# Patient Record
Sex: Male | Born: 1995 | Race: Black or African American | Hispanic: No | Marital: Single | Smoking: Current some day smoker
Health system: Southern US, Community
[De-identification: ages and names within clinical notes are randomized; demographics above are authoritative.]

---

## 2018-07-25 ENCOUNTER — Emergency Department (HOSPITAL_COMMUNITY): Payer: Self-pay

## 2018-07-25 ENCOUNTER — Emergency Department (HOSPITAL_COMMUNITY)
Admission: EM | Admit: 2018-07-25 | Discharge: 2018-07-25 | Disposition: A | Discharge status: Home / Self Care | Payer: Self-pay | Attending: Emergency Medicine | Admitting: Emergency Medicine

## 2018-07-25 ENCOUNTER — Other Ambulatory Visit: Payer: Self-pay

## 2018-07-25 DIAGNOSIS — R10815 Periumbilic abdominal tenderness: Secondary | ICD-10-CM | POA: Insufficient documentation

## 2018-07-25 DIAGNOSIS — R319 Hematuria, unspecified: Secondary | ICD-10-CM

## 2018-07-25 DIAGNOSIS — R112 Nausea with vomiting, unspecified: Secondary | ICD-10-CM | POA: Insufficient documentation

## 2018-07-25 DIAGNOSIS — R1084 Generalized abdominal pain: Secondary | ICD-10-CM | POA: Insufficient documentation

## 2018-07-25 DIAGNOSIS — R350 Frequency of micturition: Secondary | ICD-10-CM | POA: Insufficient documentation

## 2018-07-25 DIAGNOSIS — R10816 Epigastric abdominal tenderness: Secondary | ICD-10-CM | POA: Insufficient documentation

## 2018-07-25 LAB — CBC WITH DIFFERENTIAL/PLATELET
Abs Immature Granulocytes: 0.02 10*3/uL (ref 0.00–0.07)
Basophils Absolute: 0 10*3/uL (ref 0.0–0.1)
Basophils Relative: 0 %
Eosinophils Absolute: 0.1 10*3/uL (ref 0.0–0.5)
Eosinophils Relative: 2 %
HCT: 45.7 % (ref 39.0–52.0)
Hemoglobin: 14.9 g/dL (ref 13.0–17.0)
Immature Granulocytes: 0 %
Lymphocytes Relative: 15 %
Lymphs Abs: 1 10*3/uL (ref 0.7–4.0)
MCH: 27.9 pg (ref 26.0–34.0)
MCHC: 32.6 g/dL (ref 30.0–36.0)
MCV: 85.4 fL (ref 80.0–100.0)
Monocytes Absolute: 0.3 10*3/uL (ref 0.1–1.0)
Monocytes Relative: 4 %
Neutro Abs: 5.3 10*3/uL (ref 1.7–7.7)
Neutrophils Relative %: 79 %
Platelets: 157 10*3/uL (ref 150–400)
RBC: 5.35 MIL/uL (ref 4.22–5.81)
RDW: 12.1 % (ref 11.5–15.5)
WBC: 6.7 10*3/uL (ref 4.0–10.5)
nRBC: 0 % (ref 0.0–0.2)

## 2018-07-25 LAB — COMPREHENSIVE METABOLIC PANEL
ALT: 18 U/L (ref 0–44)
AST: 17 U/L (ref 15–41)
Albumin: 4.5 g/dL (ref 3.5–5.0)
Alkaline Phosphatase: 64 U/L (ref 38–126)
Anion gap: 8 (ref 5–15)
BUN: 7 mg/dL (ref 6–20)
CO2: 26 mmol/L (ref 22–32)
Calcium: 9.4 mg/dL (ref 8.9–10.3)
Chloride: 104 mmol/L (ref 98–111)
Creatinine, Ser: 0.93 mg/dL (ref 0.61–1.24)
GFR calc Af Amer: >60 mL/min (ref >60)
GFR calc non Af Amer: >60 mL/min (ref >60)
Glucose, Bld: 88 mg/dL (ref 70–99)
Potassium: 3.5 mmol/L (ref 3.5–5.1)
Sodium: 138 mmol/L (ref 135–145)
Total Bilirubin: 0.8 mg/dL (ref 0.3–1.2)
Total Protein: 7 g/dL (ref 6.5–8.1)

## 2018-07-25 LAB — URINALYSIS, ROUTINE W REFLEX MICROSCOPIC
Bacteria, UA: NONE SEEN
Bilirubin Urine: NEGATIVE
Glucose, UA: NEGATIVE mg/dL
Ketones, ur: 80 mg/dL — AB
Nitrite: NEGATIVE
Protein, ur: NEGATIVE mg/dL
RBC / HPF: >50 RBC/hpf — ABNORMAL HIGH (ref 0–5)
Specific Gravity, Urine: 1.024 (ref 1.005–1.030)
pH: 5 (ref 5.0–8.0)

## 2018-07-25 LAB — LIPASE, BLOOD: Lipase: 21 U/L (ref 11–51)

## 2018-07-25 MED ORDER — MORPHINE SULFATE (PF) 4 MG/ML IV SOLN
4.0000 mg | Freq: Once | INTRAVENOUS | Status: AC
  Administered 2018-07-25: 4 mg via INTRAVENOUS
  Filled 2018-07-25: qty 1

## 2018-07-25 MED ORDER — ONDANSETRON HCL 4 MG/2ML IJ SOLN
4.0000 mg | Freq: Once | INTRAMUSCULAR | Status: AC
  Administered 2018-07-25: 4 mg via INTRAVENOUS
  Filled 2018-07-25: qty 2

## 2018-07-25 MED ORDER — ONDANSETRON 4 MG PO TBDP: 4.0000 mg | ORAL_TABLET | Freq: Three times a day (TID) | ORAL | 0 refills | Status: AC | PRN

## 2018-07-25 MED ORDER — SODIUM CHLORIDE 0.9 % IV BOLUS
1000.0000 mL | Freq: Once | INTRAVENOUS | Status: AC
  Administered 2018-07-25: 1000 mL via INTRAVENOUS

## 2018-07-25 NOTE — Discharge Instructions (Signed)
1. Medications: Alternate 600 mg of ibuprofen and [phone] mg of Tylenol every 3 hours as needed for pain. Do not exceed 4000 mg of Tylenol daily.  Take ibuprofen with food to avoid upset stomach.  Take Zofran as needed for nausea.  Wait around 20 minutes before eating or drinking after taking this medication. 2. Treatment: rest, drink plenty of fluids, advance diet slowly.  Start with water and broth then advance to bland foods that will not upset your stomach such as crackers, mashed potatoes, and peanut butter. 3. Follow Up: Please followup with your primary doctor in 3 days for discussion of your diagnoses and further evaluation after today's visit; if you do not have a primary care doctor use the resource guide provided to find one; Please return to the ER for persistent vomiting, high fevers or worsening symptoms

## 2018-07-25 NOTE — ED Triage Notes (Signed)
Per pt, has had > 4 emesis since last night after eating catfish. Pt has 10/10 abdominal pain.

## 2018-07-25 NOTE — ED Provider Notes (Signed)
[facility] EMERGENCY DEPARTMENT Provider Note   CSN: [id number] Arrival date & time: 07/25/18  1111    History   Chief Complaint Chief Complaint  Patient presents with   Abdominal Pain   Emesis    HPI George Bernard is a 23 y.o. male presents for evaluation of acute onset, progressively worsening nausea and vomiting since yesterday.  Reports symptoms began after eating catfish for dinner and pizza that had been out "for a while "earlier in the day.  Notes intermittent generalized throbbing abdominal pain which does not radiate.  He reports it worsens prior to vomiting and improves after emesis.  Has had multiple episodes of nonbloody nonbilious emesis.  Last bowel movement was yesterday morning prior to symptom onset was normal for him.  Denies fevers, chills, chest pain, shortness of breath. Has had some urinary frequency but otherwise no dysuria or hematuria. Attempted to take ibuprofen without relief of his symptoms.  Denies recent travel, recent treatment with antibiotics.  No known sick contacts.  Girlfriend at the bedside also ate catfish but is not having any similar symptoms.  [place] black in [place], denies alcohol use or recreational drug use.     The history is provided by the patient.    No past medical history on file.  There are no active problems to display for this patient.  Home Medications    Prior to Admission medications   Medication Sig Start Date End Date Taking? Authorizing Provider  ondansetron (ZOFRAN ODT) 4 MG disintegrating tablet Take 1 tablet (4 mg total) by mouth every 8 (eight) hours as needed for up to 3 days for nausea or vomiting. 07/25/18 07/28/18  Jeanie Sewer, PA-C    Family History No family history on file.  Social History Social History   Tobacco Use   Smoking status: Not on file  Substance Use Topics   Alcohol use: Not on file   Drug use: Not on file     Allergies   Patient has no allergy information on  record.   Review of Systems Review of Systems  Constitutional: Negative for chills and fever.  Respiratory: Negative for shortness of breath.   Cardiovascular: Negative for chest pain.  Gastrointestinal: Positive for abdominal pain, nausea and vomiting. Negative for constipation and diarrhea.  Genitourinary: Positive for frequency. Negative for dysuria and hematuria.  All other systems reviewed and are negative.    Physical Exam Updated Vital Signs BP 129/87 (BP Location: Right Arm)    Pulse 74    Temp 97.8 F (36.6 C) (Oral)    Resp 16    Ht 5\' 8"  (1.727 m)    Wt 65.8 kg    SpO2 100%    BMI 22.05 kg/m   Physical Exam Vitals signs and nursing note reviewed.  Constitutional:      General: He is not in acute distress.    Appearance: He is well-developed.  HENT:     Head: Normocephalic and atraumatic.  Eyes:     General:        Right eye: No discharge.        Left eye: No discharge.     Conjunctiva/sclera: Conjunctivae normal.  Neck:     Vascular: No JVD.     Trachea: No tracheal deviation.  Cardiovascular:     Rate and Rhythm: Normal rate and regular rhythm.  Pulmonary:     Effort: Pulmonary effort is normal.     Breath sounds: Normal breath sounds.  Abdominal:  General: Abdomen is flat. Bowel sounds are normal. There is no distension.     Tenderness: There is abdominal tenderness in the epigastric area, periumbilical area and suprapubic area. There is no right CVA tenderness, left CVA tenderness or rebound. Negative signs include Murphy's sign and Rovsing's sign.  Skin:    Findings: No erythema.  Neurological:     Mental Status: He is alert.  Psychiatric:        Behavior: Behavior normal.      ED Treatments / Results  Labs (all labs ordered are listed, but only abnormal results are displayed) Labs Reviewed  URINALYSIS, ROUTINE W REFLEX MICROSCOPIC - Abnormal; Notable for the following components:      Result Value   APPearance HAZY (*)    Hgb urine  dipstick MODERATE (*)    Ketones, ur 80 (*)    Leukocytes,Ua SMALL (*)    RBC / HPF >50 (*)    All other components within normal limits  URINE CULTURE  CBC WITH DIFFERENTIAL/PLATELET  COMPREHENSIVE METABOLIC PANEL  LIPASE, BLOOD    EKG None  Radiology Ct Renal Stone Study  Result Date: 07/25/2018 CLINICAL DATA:  Lower abdominal pain with nausea and vomiting. Symptoms began yesterday after eating catfish. Flank pain, stone disease suspected. EXAM: CT ABDOMEN AND PELVIS WITHOUT CONTRAST TECHNIQUE: Multidetector CT imaging of the abdomen and pelvis was performed following the standard protocol without IV contrast. COMPARISON:  None. FINDINGS: Lower chest: The lung bases are clear without focal nodule, mass, or airspace disease. Hepatobiliary: No focal liver abnormality is seen. No gallstones, gallbladder wall thickening, or biliary dilatation. Pancreas: Unremarkable. No pancreatic ductal dilatation or surrounding inflammatory changes. Spleen: Normal in size without focal abnormality. Adrenals/Urinary Tract: Adrenal glands are normal bilaterally. Kidneys and ureters are within normal limits. Stomach/Bowel: Stomach and duodenum are normal. Small bowel is unremarkable. There is some stranding in the mesentery and small lymph nodes. Terminal ileum is within normal limits. Appendix is visualized and normal. The ascending and transverse colon are normal. The descending and sigmoid colon are normal. Vascular/Lymphatic: No significant vascular findings are present. No enlarged abdominal or pelvic lymph nodes. Reproductive: Uterus and bilateral adnexa are unremarkable. Other: No abdominal wall hernia or abnormality. No abdominopelvic ascites. Musculoskeletal: No acute or significant osseous findings. IMPRESSION: 1. No nephrolithiasis or obstructive uropathy. 2. Some stranding and small lymph nodes are present within the small bowel mesentery. This may represent a nonspecific enteritis. Electronically Signed    By: Marin Roberts M.D.   On: 07/25/2018 15:03    Procedures Procedures (including critical care time)  Medications Ordered in ED Medications  sodium chloride 0.9 % bolus 1,000 mL (0 mLs Intravenous Stopped 07/25/18 1536)  ondansetron (ZOFRAN) injection 4 mg (4 mg Intravenous Given 07/25/18 1140)  morphine 4 MG/ML injection 4 mg (4 mg Intravenous Given 07/25/18 1219)     Initial Impression / Assessment and Plan / ED Course  I have reviewed the triage vital signs and the nursing notes.  Pertinent labs & imaging results that were available during my care of the patient were reviewed by me and considered in my medical decision making (see chart for details).        Patient presenting for evaluation of generalized abdominal pains, nausea and vomiting since yesterday.  He is afebrile, vital signs are stable.  He is nontoxic in appearance.  No peritoneal signs on examination of the abdomen.  Lab work reviewed by me overall unremarkable.  However, he does have gross hematuria  in his UA and some ketonuria.  He endorses some urinary frequency but otherwise no urinary symptoms.  CT renal stone study shows no evidence of nephrolithiasis or obstructive uropathy.  He does have some stranding and small lymph nodes within the small bowel mesentery which could suggest a nonspecific enteritis.  No evidence of acute surgical abdominal pathology including obstruction, perforation, appendicitis, cholecystitis, or dissection.  He was given IV fluids and Zofran in the ED with significant improvement.  On reassessment he is resting comfortably no apparent distress.  He is tolerating p.o. fluids without difficulty and serial abdominal examinations remain benign.  He tells me that he has no concern for STDs but he does get checked for STDs yearly.  I recommended follow-up at the health department for any future STD testing or treatment.  We had a shared decision-making conversation and he would like to hold off on  treating for a UTI at this time as there is no clear indication to do so and will wait for the urine culture.  Will discharge with Zofran and instructions to advance diet slowly.  Discuss strict ED return precautions. Pt verbalized understanding of and agreement with plan and is safe for discharge home at this time.   Final Clinical Impressions(s) / ED Diagnoses   Final diagnoses:  Non-intractable vomiting with nausea, unspecified vomiting type  Generalized abdominal pain  Hematuria of unknown cause    ED Discharge Orders         Ordered    ondansetron (ZOFRAN ODT) 4 MG disintegrating tablet  Every 8 hours PRN     07/25/18 1521           Jeanie Sewer, PA-C 07/25/18 1548    Jacalyn Lefevre, MD 07/29/18 [phone]

## 2018-07-26 LAB — URINE CULTURE: Culture: NO GROWTH

## 2018-10-02 ENCOUNTER — Other Ambulatory Visit: Payer: Self-pay

## 2018-10-02 ENCOUNTER — Emergency Department (HOSPITAL_COMMUNITY)
Admission: EM | Admit: 2018-10-02 | Discharge: 2018-10-02 | Disposition: A | Discharge status: Home / Self Care | Payer: Self-pay | Attending: Emergency Medicine | Admitting: Emergency Medicine

## 2018-10-02 ENCOUNTER — Encounter (HOSPITAL_COMMUNITY): Payer: Self-pay | Admitting: Emergency Medicine

## 2018-10-02 DIAGNOSIS — Z79899 Other long term (current) drug therapy: Secondary | ICD-10-CM | POA: Insufficient documentation

## 2018-10-02 DIAGNOSIS — K529 Noninfective gastroenteritis and colitis, unspecified: Secondary | ICD-10-CM | POA: Insufficient documentation

## 2018-10-02 DIAGNOSIS — F17210 Nicotine dependence, cigarettes, uncomplicated: Secondary | ICD-10-CM | POA: Insufficient documentation

## 2018-10-02 DIAGNOSIS — Z1159 Encounter for screening for other viral diseases: Secondary | ICD-10-CM | POA: Insufficient documentation

## 2018-10-02 LAB — CBC WITH DIFFERENTIAL/PLATELET
Abs Immature Granulocytes: 0.02 10*3/uL (ref 0.00–0.07)
Basophils Absolute: 0 10*3/uL (ref 0.0–0.1)
Basophils Relative: 0 %
Eosinophils Absolute: 0 10*3/uL (ref 0.0–0.5)
Eosinophils Relative: 0 %
HCT: 52.7 % — ABNORMAL HIGH (ref 39.0–52.0)
Hemoglobin: 17.3 g/dL — ABNORMAL HIGH (ref 13.0–17.0)
Immature Granulocytes: 0 %
Lymphocytes Relative: 14 %
Lymphs Abs: 1.3 10*3/uL (ref 0.7–4.0)
MCH: 28.9 pg (ref 26.0–34.0)
MCHC: 32.8 g/dL (ref 30.0–36.0)
MCV: 88.1 fL (ref 80.0–100.0)
Monocytes Absolute: 0.3 10*3/uL (ref 0.1–1.0)
Monocytes Relative: 3 %
Neutro Abs: 7.8 10*3/uL — ABNORMAL HIGH (ref 1.7–7.7)
Neutrophils Relative %: 83 %
Platelets: 198 10*3/uL (ref 150–400)
RBC: 5.98 MIL/uL — ABNORMAL HIGH (ref 4.22–5.81)
RDW: 12.9 % (ref 11.5–15.5)
WBC: 9.4 10*3/uL (ref 4.0–10.5)
nRBC: 0 % (ref 0.0–0.2)

## 2018-10-02 LAB — COMPREHENSIVE METABOLIC PANEL
ALT: 26 U/L (ref 0–44)
AST: 30 U/L (ref 15–41)
Albumin: 5.5 g/dL — ABNORMAL HIGH (ref 3.5–5.0)
Alkaline Phosphatase: 77 U/L (ref 38–126)
Anion gap: 13 (ref 5–15)
BUN: 12 mg/dL (ref 6–20)
CO2: 26 mmol/L (ref 22–32)
Calcium: 10.3 mg/dL (ref 8.9–10.3)
Chloride: 100 mmol/L (ref 98–111)
Creatinine, Ser: 1.02 mg/dL (ref 0.61–1.24)
GFR calc Af Amer: >60 mL/min (ref >60)
GFR calc non Af Amer: >60 mL/min (ref >60)
Glucose, Bld: 110 mg/dL — ABNORMAL HIGH (ref 70–99)
Potassium: 3.7 mmol/L (ref 3.5–5.1)
Sodium: 139 mmol/L (ref 135–145)
Total Bilirubin: 0.5 mg/dL (ref 0.3–1.2)
Total Protein: 8.9 g/dL — ABNORMAL HIGH (ref 6.5–8.1)

## 2018-10-02 LAB — URINALYSIS, ROUTINE W REFLEX MICROSCOPIC
Bilirubin Urine: NEGATIVE
Glucose, UA: NEGATIVE mg/dL
Hgb urine dipstick: NEGATIVE
Ketones, ur: 20 mg/dL — AB
Nitrite: NEGATIVE
Protein, ur: 30 mg/dL — AB
Specific Gravity, Urine: 1.027 (ref 1.005–1.030)
pH: 5 (ref 5.0–8.0)

## 2018-10-02 LAB — LIPASE, BLOOD: Lipase: 20 U/L (ref 11–51)

## 2018-10-02 LAB — SARS CORONAVIRUS 2 BY RT PCR (HOSPITAL ORDER, PERFORMED IN ~~LOC~~ HOSPITAL LAB): SARS Coronavirus 2: NEGATIVE

## 2018-10-02 MED ORDER — SODIUM CHLORIDE 0.9 % IV BOLUS
1000.0000 mL | Freq: Once | INTRAVENOUS | Status: AC
  Administered 2018-10-02: 1000 mL via INTRAVENOUS

## 2018-10-02 MED ORDER — LORAZEPAM 2 MG/ML IJ SOLN
1.0000 mg | Freq: Once | INTRAMUSCULAR | Status: AC
  Administered 2018-10-02: 1 mg via INTRAVENOUS
  Filled 2018-10-02: qty 1

## 2018-10-02 MED ORDER — FENTANYL CITRATE (PF) 100 MCG/2ML IJ SOLN
100.0000 ug | Freq: Once | INTRAMUSCULAR | Status: AC
  Administered 2018-10-02: 05:00:00 100 ug via INTRAVENOUS
  Filled 2018-10-02: qty 2

## 2018-10-02 MED ORDER — ONDANSETRON 8 MG PO TBDP: 8.0000 mg | ORAL_TABLET | Freq: Three times a day (TID) | ORAL | 0 refills | Status: AC | PRN

## 2018-10-02 MED ORDER — ONDANSETRON HCL 4 MG/2ML IJ SOLN
4.0000 mg | Freq: Once | INTRAMUSCULAR | Status: AC
  Administered 2018-10-02: 4 mg via INTRAVENOUS
  Filled 2018-10-02: qty 2

## 2018-10-02 NOTE — ED Notes (Signed)
Pt resting in bed. IV placed, meds given and labs drawn. Pt given urinal and reminded about a urine sample. Pt in NAD.

## 2018-10-02 NOTE — ED Triage Notes (Signed)
Pt report >10 emesis since 1300 on 10/01/18 reports generalized weakness and nausea. Denies fever or bodyaches

## 2018-10-02 NOTE — ED Provider Notes (Signed)
[facility]-EMERGENCY DEPT Provider Note: Lowella Dell, MD, FACEP  CSN: [id number] MRN: [id number] ARRIVAL: 10/02/18 at 0348 ROOM: [facility]17/[facility]   CHIEF COMPLAINT  Vomiting   HISTORY OF PRESENT ILLNESS  10/02/18 4:17 AM George Bernard is a 23 y.o. male with persistent vomiting since yesterday afternoon about 1 PM.  He describes the emesis as bilious.  He has had associated abdominal pain which is diffuse.  He describes it as feeling like someone is punching him in the stomach.  He describes the pain as being about a 7 out of 10 but increasing to 10 out of 10 then relieved partially by vomiting.  He is not aware of having a fever.  He does have general weakness and malaise and believes he passed out once yesterday.  He has not had diarrhea but has been moving his bowels more frequently than usual.  He feels somewhat short of breath but has not been coughing.   History reviewed. No pertinent past medical history.  History reviewed. No pertinent surgical history.  History reviewed. No pertinent family history.  Social History   Tobacco Use  . Smoking status: Current Some Day Smoker    Types: Cigars  . Smokeless tobacco: Never Used  Substance Use Topics  . Alcohol use: Never    Frequency: Never  . Drug use: Never    Prior to Admission medications   Medication Sig Start Date End Date Taking? Authorizing Provider  ibuprofen (ADVIL) 200 MG tablet Take 400 mg by mouth every 6 (six) hours as needed for moderate pain.   Yes [provider]    Allergies Patient has no known allergies.   REVIEW OF SYSTEMS  Negative except as noted here or in the History of Present Illness.   PHYSICAL EXAMINATION  Initial Vital Signs Blood pressure (!) 130/91, pulse 72, temperature 98.1 F (36.7 C), temperature source Oral, resp. rate 14, height 5\' 8"  (1.727 m), weight 68 kg, SpO2 100 %.  Examination General: Well-developed, well-nourished male in no acute distress; appearance consistent with  age of record HENT: normocephalic; atraumatic Eyes: pupils equal, round and reactive to light; extraocular muscles intact Neck: supple Heart: regular rate and rhythm Lungs: clear to auscultation bilaterally Abdomen: soft; nondistended; diffuse tenderness; no masses or hepatosplenomegaly; bowel sounds hypoactive Extremities: No deformity; full range of motion; pulses normal Neurologic: Awake, alert and oriented; motor function intact in all extremities and symmetric; no facial droop Skin: Warm and dry Psychiatric: Flat affect   RESULTS  Summary of this visit's results, reviewed by myself:   EKG Interpretation  Date/Time:    Ventricular Rate:    PR Interval:    QRS Duration:   QT Interval:    QTC Calculation:   R Axis:     Text Interpretation:        Laboratory Studies: Results for orders placed or performed during the hospital encounter of 10/02/18 (from the past 24 hour(s))  CBC with Differential/Platelet     Status: Abnormal   Collection Time: 10/02/18  4:50 AM  Result Value Ref Range   WBC 9.4 4.0 - 10.5 K/uL   RBC 5.98 (H) 4.22 - 5.81 MIL/uL   Hemoglobin 17.3 (H) 13.0 - 17.0 g/dL   HCT 80.3 (H) 21.2 - 24.8 %   MCV 88.1 80.0 - 100.0 fL   MCH 28.9 26.0 - 34.0 pg   MCHC 32.8 30.0 - 36.0 g/dL   RDW 25.0 03.7 - 04.8 %   Platelets 198 150 - 400 K/uL  nRBC 0.0 0.0 - 0.2 %   Neutrophils Relative % 83 %   Neutro Abs 7.8 (H) 1.7 - 7.7 K/uL   Lymphocytes Relative 14 %   Lymphs Abs 1.3 0.7 - 4.0 K/uL   Monocytes Relative 3 %   Monocytes Absolute 0.3 0.1 - 1.0 K/uL   Eosinophils Relative 0 %   Eosinophils Absolute 0.0 0.0 - 0.5 K/uL   Basophils Relative 0 %   Basophils Absolute 0.0 0.0 - 0.1 K/uL   Immature Granulocytes 0 %   Abs Immature Granulocytes 0.02 0.00 - 0.07 K/uL  Comprehensive metabolic panel     Status: Abnormal   Collection Time: 10/02/18  4:50 AM  Result Value Ref Range   Sodium 139 135 - 145 mmol/L   Potassium 3.7 3.5 - 5.1 mmol/L   Chloride 100 98 -  111 mmol/L   CO2 26 22 - 32 mmol/L   Glucose, Bld 110 (H) 70 - 99 mg/dL   BUN 12 6 - 20 mg/dL   Creatinine, Ser 1.61 0.61 - 1.24 mg/dL   Calcium 09.6 8.9 - 04.5 mg/dL   Total Protein 8.9 (H) 6.5 - 8.1 g/dL   Albumin 5.5 (H) 3.5 - 5.0 g/dL   AST 30 15 - 41 U/L   ALT 26 0 - 44 U/L   Alkaline Phosphatase 77 38 - 126 U/L   Total Bilirubin 0.5 0.3 - 1.2 mg/dL   GFR calc non Af Amer >60 >60 mL/min   GFR calc Af Amer >60 >60 mL/min   Anion gap 13 5 - 15  Lipase, blood     Status: None   Collection Time: 10/02/18  4:50 AM  Result Value Ref Range   Lipase 20 11 - 51 U/L  SARS Coronavirus 2 (CEPHEID - Performed in [facility] hospital lab), Hosp Order     Status: None   Collection Time: 10/02/18  4:50 AM  Result Value Ref Range   SARS Coronavirus 2 NEGATIVE NEGATIVE  Urinalysis, Routine w reflex microscopic     Status: Abnormal   Collection Time: 10/02/18  5:43 AM  Result Value Ref Range   Color, Urine YELLOW YELLOW   APPearance CLOUDY (A) CLEAR   Specific Gravity, Urine 1.027 1.005 - 1.030   pH 5.0 5.0 - 8.0   Glucose, UA NEGATIVE NEGATIVE mg/dL   Hgb urine dipstick NEGATIVE NEGATIVE   Bilirubin Urine NEGATIVE NEGATIVE   Ketones, ur 20 (A) NEGATIVE mg/dL   Protein, ur 30 (A) NEGATIVE mg/dL   Nitrite NEGATIVE NEGATIVE   Leukocytes,Ua SMALL (A) NEGATIVE   RBC / HPF 0-5 0 - 5 RBC/hpf   WBC, UA 11-20 0 - 5 WBC/hpf   Bacteria, UA RARE (A) NONE SEEN   Squamous Epithelial / LPF 0-5 0 - 5   Mucus PRESENT    Imaging Studies: No results found.  ED COURSE and MDM  Nursing notes and initial vitals signs, including pulse oximetry, reviewed.  Vitals:   10/02/18 0358 10/02/18 0500 10/02/18 0640  BP: (!) 130/91 (!) 148/99 138/72  Pulse: 72 63 68  Resp: Temp: 98.1 F (36.7 C)  98.3 F (36.8 C)  TempSrc: Oral  Oral  SpO2: 100% 97% 100%  Weight: 68 kg    Height:  (1.727 m)     6:47 AM Patient's vomiting initially controlled with Zofran but patient had a return of  nausea and vomiting.  He was given Ativan 1 mg IV with significant relief.  6:55  AM Patient tolerating fluids orally without emesis.  Patient appears stable for discharge.  Symptoms consistent with viral gastroenteritis.  PROCEDURES    ED DIAGNOSES     ICD-10-CM   1. Gastroenteritis K52.9        Genecis Veley, MD 10/02/18 [phone]

## 2018-10-02 NOTE — ED Notes (Signed)
Pt given and verbalized understanding of d/c instructions and need for follow up with pcp. Told to return if s/s worsen. No further distress or questions at time of ambulation out of department.  

## 2018-10-02 NOTE — ED Notes (Signed)
Pt given gingerale for PO challenge 

## 2020-02-17 IMAGING — CT CT RENAL STONE PROTOCOL
2 of 4 series · 16 of 46 positions shown, 18 images · non-contrast
Comparison: None.

CLINICAL DATA: Lower abdominal pain with nausea and vomiting.
Symptoms began yesterday after eating catfish. Flank pain, stone
disease suspected.

EXAM:
CT ABDOMEN AND PELVIS WITHOUT CONTRAST
TECHNIQUE: Multidetector CT imaging of the abdomen and pelvis was performed
following the standard protocol without IV contrast.

[Series 3: renal stone 5.0 · axial · 0.65mm/px · z∈[+696,+1106]mm · 13 of 90 slices shown, 15 images]
[im 4/90  soft-tissue]
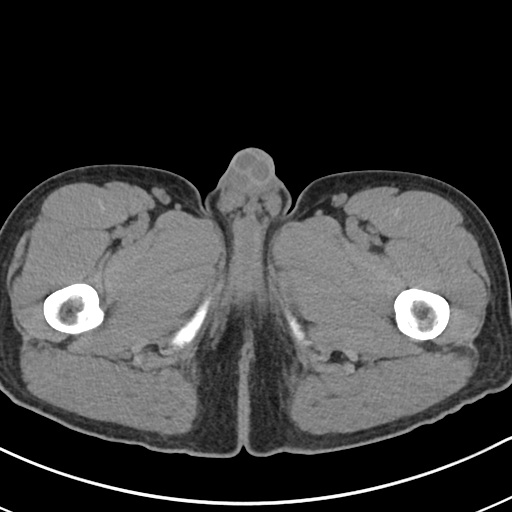
[im 4/90  bone]
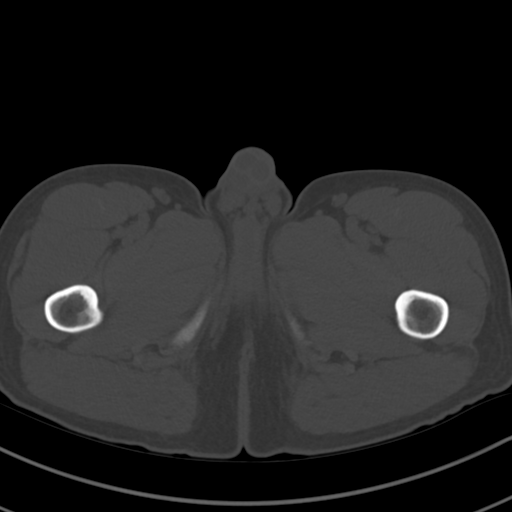
[im 12/90  soft-tissue]
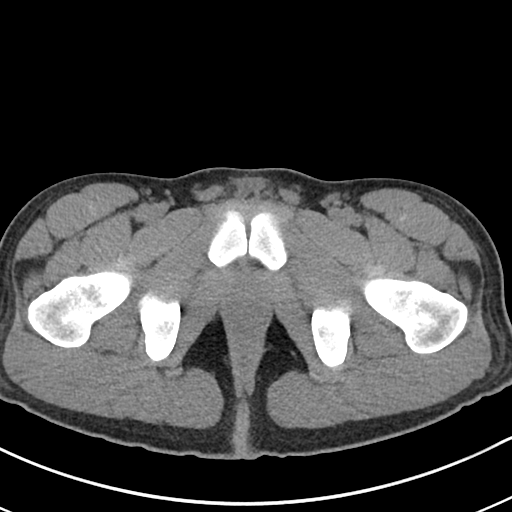
[im 20/90  soft-tissue]
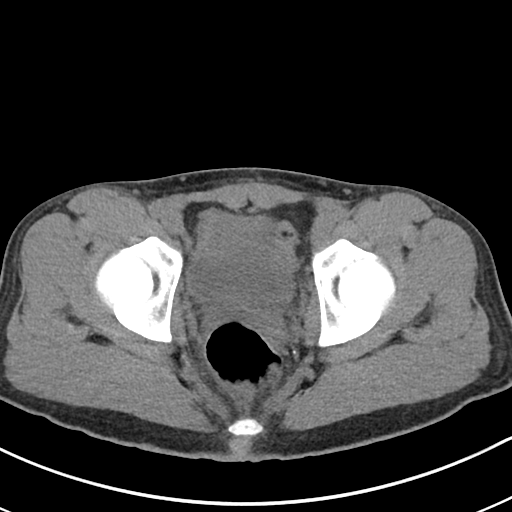
[im 24/90  soft-tissue]
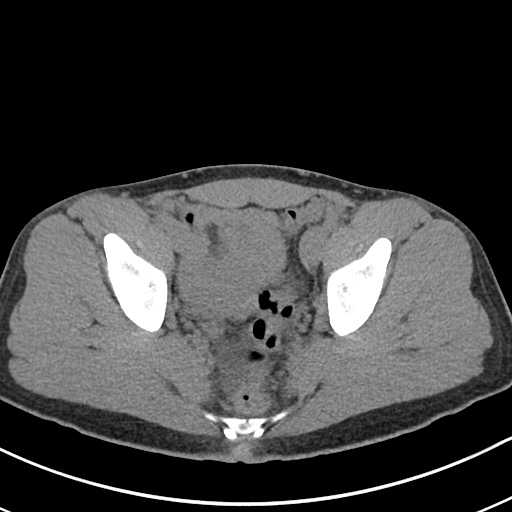
[im 31/90  soft-tissue]
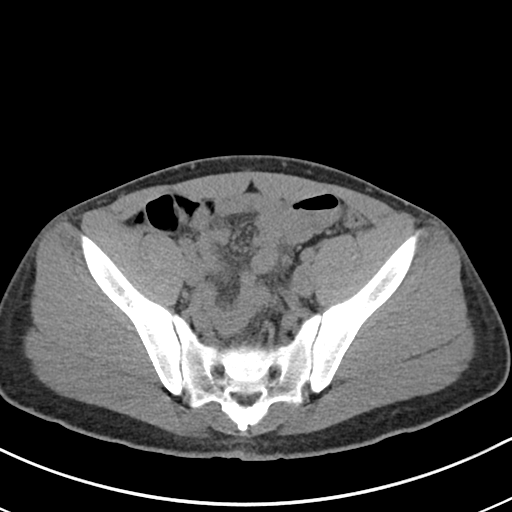
[im 39/90  soft-tissue]
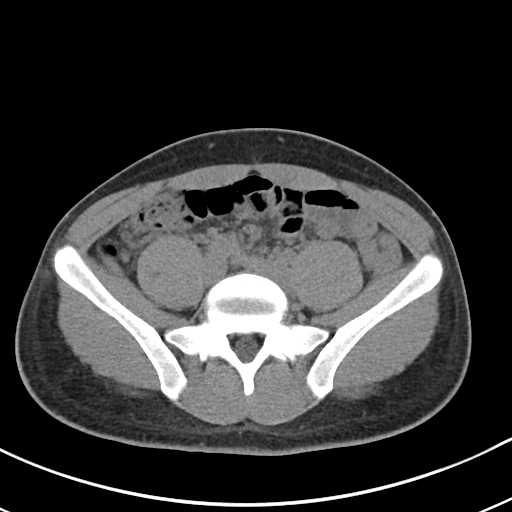
[im 47/90  soft-tissue]
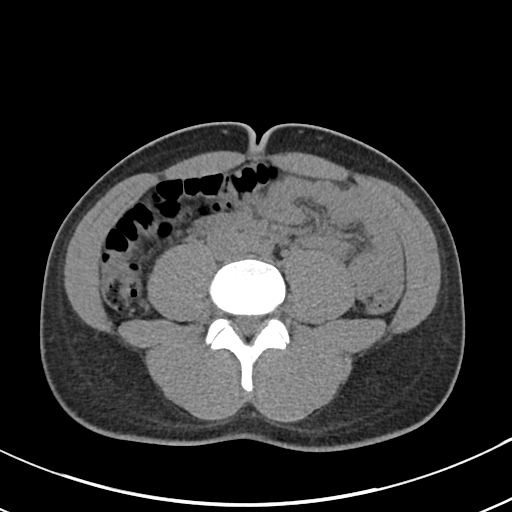
[im 51/90  soft-tissue]
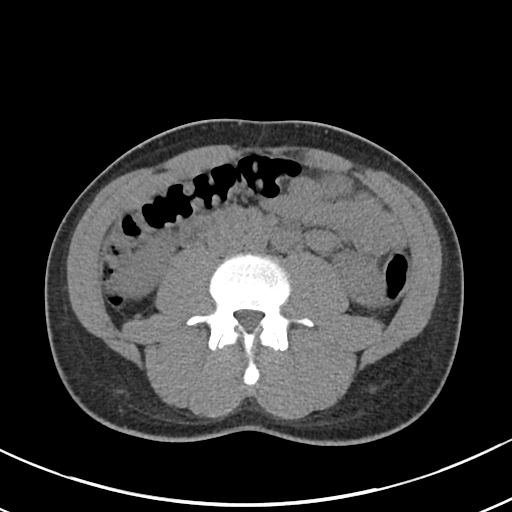
[im 59/90  soft-tissue]
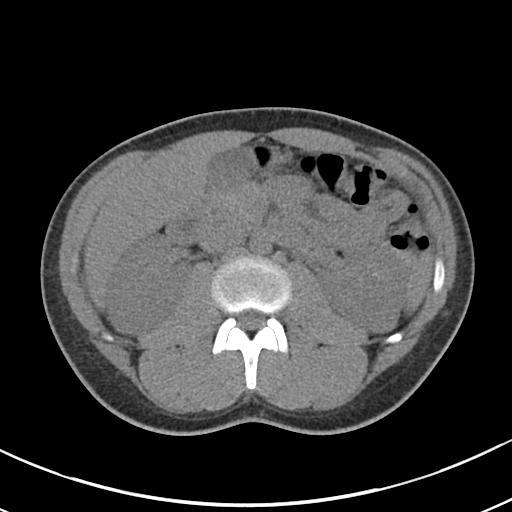
[im 59/90  bone]
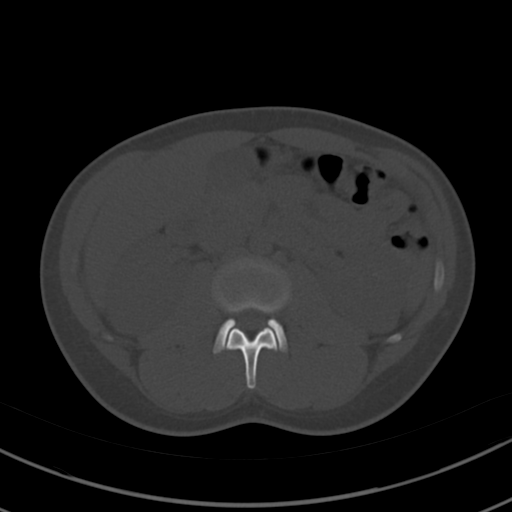
[im 66/90  soft-tissue]
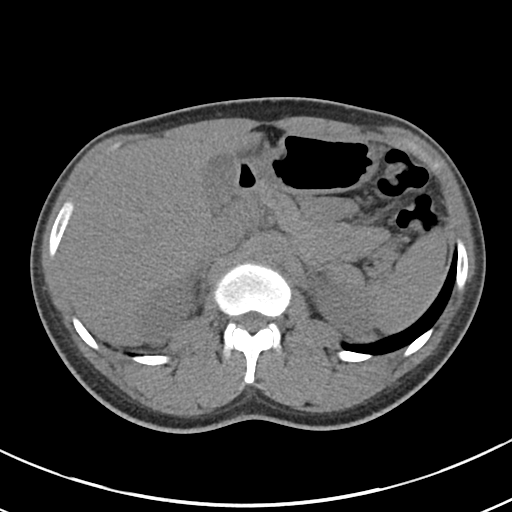
[im 70/90  soft-tissue]
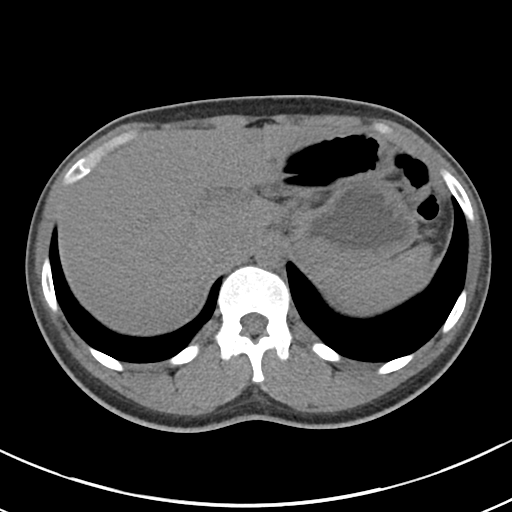
[im 78/90  soft-tissue]
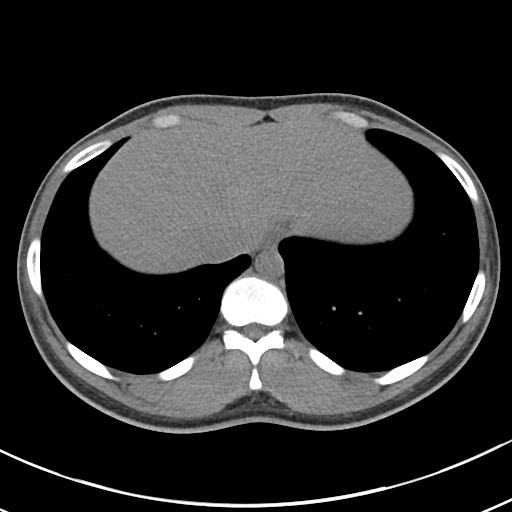
[im 86/90  soft-tissue]
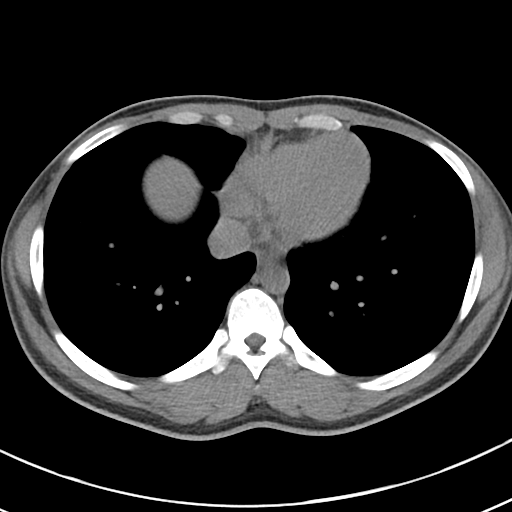

[Series 5: renal stone 3.0 cor · coronal · 0.68mm/px · 3 of 100 slices shown]
[im 34/100  soft-tissue]
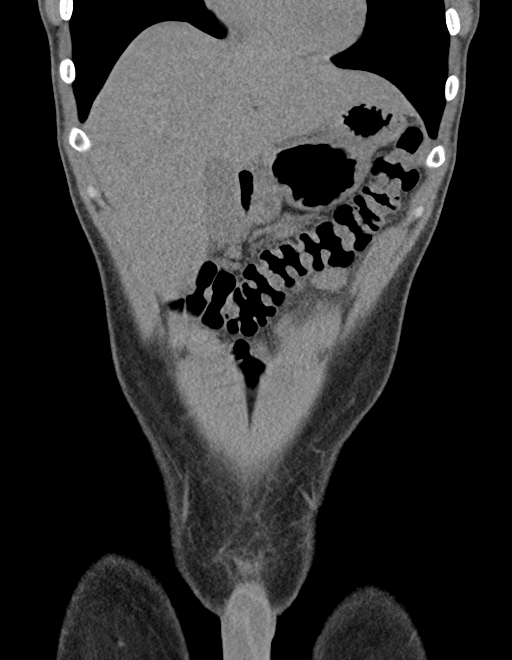
[im 45/100  soft-tissue]
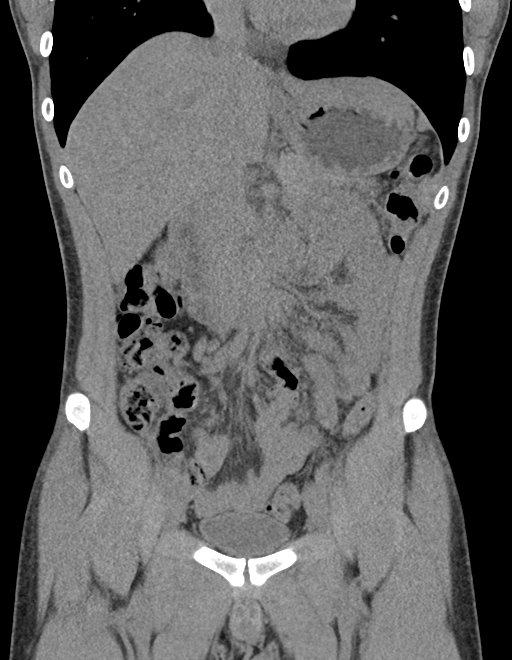
[im 56/100  soft-tissue]
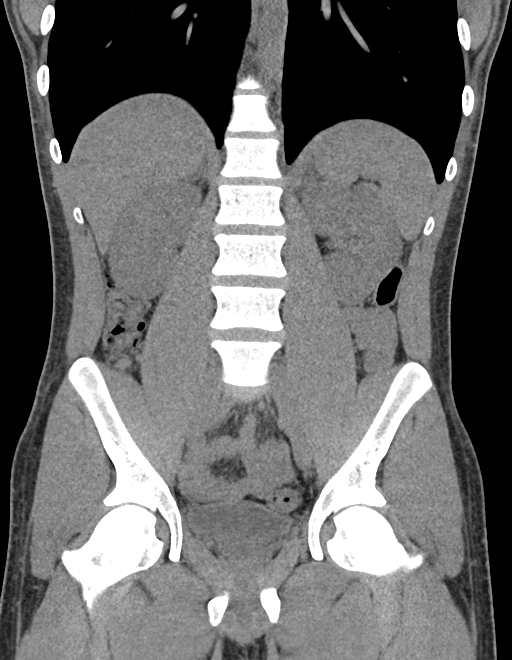

[16 of 46 positions shown; findings below may reference images not displayed]

FINDINGS: Lower chest: The lung bases are clear without focal nodule, mass, or
airspace disease.

Hepatobiliary: No focal liver abnormality is seen. No gallstones,
gallbladder wall thickening, or biliary dilatation.

Pancreas: Unremarkable. No pancreatic ductal dilatation or
surrounding inflammatory changes.

Spleen: Normal in size without focal abnormality.

Adrenals/Urinary Tract: Adrenal glands are normal bilaterally.
Kidneys and ureters are within normal limits.

Stomach/Bowel: Stomach and duodenum are normal. Small bowel is
unremarkable. There is some stranding in the mesentery and small
lymph nodes. Terminal ileum is within normal limits. Appendix is
visualized and normal. The ascending and transverse colon are
normal. The descending and sigmoid colon are normal.

Vascular/Lymphatic: No significant vascular findings are present. No
enlarged abdominal or pelvic lymph nodes.

Reproductive: Uterus and bilateral adnexa are unremarkable.

Other: No abdominal wall hernia or abnormality. No abdominopelvic
ascites.

Musculoskeletal: No acute or significant osseous findings.
IMPRESSION: 1. No nephrolithiasis or obstructive uropathy.
2. Some stranding and small lymph nodes are present within the small
bowel mesentery. This may represent a nonspecific enteritis.
